# Patient Record
Sex: Male | Born: 1985 | Race: Black or African American | Hispanic: No | Marital: Single | Smoking: Never smoker
Health system: Southern US, Community
[De-identification: ages and names within clinical notes are randomized; demographics above are authoritative.]

## PROBLEM LIST (undated history)

## (undated) DIAGNOSIS — D649 Anemia, unspecified: Secondary | ICD-10-CM

## (undated) HISTORY — PX: WISDOM TOOTH EXTRACTION: SHX21

## (undated) HISTORY — PX: TOENAIL EXCISION: SUR558

## (undated) HISTORY — PX: TESTICLE SURGERY: SHX794

---

## 2020-05-24 ENCOUNTER — Emergency Department (HOSPITAL_COMMUNITY): Payer: Self-pay

## 2020-05-24 ENCOUNTER — Emergency Department (HOSPITAL_COMMUNITY)
Admission: EM | Admit: 2020-05-24 | Discharge: 2020-05-25 | Disposition: A | Discharge status: Home / Self Care | Payer: Self-pay | Attending: Emergency Medicine | Admitting: Emergency Medicine

## 2020-05-24 DIAGNOSIS — Y999 Unspecified external cause status: Secondary | ICD-10-CM | POA: Insufficient documentation

## 2020-05-24 DIAGNOSIS — S42322A Displaced transverse fracture of shaft of humerus, left arm, initial encounter for closed fracture: Secondary | ICD-10-CM | POA: Insufficient documentation

## 2020-05-24 DIAGNOSIS — Y92410 Unspecified street and highway as the place of occurrence of the external cause: Secondary | ICD-10-CM | POA: Insufficient documentation

## 2020-05-24 DIAGNOSIS — Y9389 Activity, other specified: Secondary | ICD-10-CM | POA: Insufficient documentation

## 2020-05-24 DIAGNOSIS — T1490XA Injury, unspecified, initial encounter: Secondary | ICD-10-CM

## 2020-05-24 LAB — COMPREHENSIVE METABOLIC PANEL
ALT: 20 U/L (ref 0–44)
AST: 25 U/L (ref 15–41)
Albumin: 3.8 g/dL (ref 3.5–5.0)
Alkaline Phosphatase: 50 U/L (ref 38–126)
Anion gap: 6 (ref 5–15)
BUN: 16 mg/dL (ref 6–20)
CO2: 24 mmol/L (ref 22–32)
Calcium: 8.4 mg/dL — ABNORMAL LOW (ref 8.9–10.3)
Chloride: 107 mmol/L (ref 98–111)
Creatinine, Ser: 0.95 mg/dL (ref 0.61–1.24)
GFR calc Af Amer: >60 mL/min (ref >60)
GFR calc non Af Amer: >60 mL/min (ref >60)
Glucose, Bld: 130 mg/dL — ABNORMAL HIGH (ref 70–99)
Potassium: 4 mmol/L (ref 3.5–5.1)
Sodium: 137 mmol/L (ref 135–145)
Total Bilirubin: 0.5 mg/dL (ref 0.3–1.2)
Total Protein: 6.5 g/dL (ref 6.5–8.1)

## 2020-05-24 LAB — CBC
HCT: 29.3 % — ABNORMAL LOW (ref 39.0–52.0)
Hemoglobin: 8.6 g/dL — ABNORMAL LOW (ref 13.0–17.0)
MCH: 24.2 pg — ABNORMAL LOW (ref 26.0–34.0)
MCHC: 29.4 g/dL — ABNORMAL LOW (ref 30.0–36.0)
MCV: 82.3 fL (ref 80.0–100.0)
Platelets: 326 10*3/uL (ref 150–400)
RBC: 3.56 MIL/uL — ABNORMAL LOW (ref 4.22–5.81)
RDW: 16 % — ABNORMAL HIGH (ref 11.5–15.5)
WBC: 11 10*3/uL — ABNORMAL HIGH (ref 4.0–10.5)
nRBC: 0 % (ref 0.0–0.2)

## 2020-05-24 LAB — ETHANOL: Alcohol, Ethyl (B): <10 mg/dL (ref <10)

## 2020-05-24 LAB — I-STAT CHEM 8, ED
BUN: 18 mg/dL (ref 6–20)
Calcium, Ion: 1.13 mmol/L — ABNORMAL LOW (ref 1.15–1.40)
Chloride: 105 mmol/L (ref 98–111)
Creatinine, Ser: 0.9 mg/dL (ref 0.61–1.24)
Glucose, Bld: 121 mg/dL — ABNORMAL HIGH (ref 70–99)
HCT: 29 % — ABNORMAL LOW (ref 39.0–52.0)
Hemoglobin: 9.9 g/dL — ABNORMAL LOW (ref 13.0–17.0)
Potassium: 4 mmol/L (ref 3.5–5.1)
Sodium: 139 mmol/L (ref 135–145)
TCO2: 23 mmol/L (ref 22–32)

## 2020-05-24 LAB — LACTIC ACID, PLASMA: Lactic Acid, Venous: 1.5 mmol/L (ref 0.5–1.9)

## 2020-05-24 LAB — PROTIME-INR
INR: 1 (ref 0.8–1.2)
Prothrombin Time: 12.6 seconds (ref 11.4–15.2)

## 2020-05-24 LAB — SAMPLE TO BLOOD BANK

## 2020-05-24 MED ORDER — FENTANYL CITRATE (PF) 100 MCG/2ML IJ SOLN
100.0000 ug | Freq: Once | INTRAMUSCULAR | Status: AC
  Administered 2020-05-25: 100 ug via INTRAVENOUS
  Filled 2020-05-24: qty 2

## 2020-05-24 NOTE — ED Notes (Signed)
Jeremiah Munoz father [phone] would like an update on the pt
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
## 2020-05-25 MED ORDER — OXYCODONE-ACETAMINOPHEN 5-325 MG PO TABS
1.0000 | ORAL_TABLET | ORAL | 0 refills | Status: DC | PRN
Start: 2020-05-25 — End: 2020-05-29

## 2020-05-25 NOTE — Discharge Instructions (Addendum)
A referral has been sent for you to see orthopedics.  Please follow-up with them and the numbers attached if you do not hear from them.  Please also take 1 percocet as needed every 4 hours for pain.

## 2020-05-25 NOTE — ED Notes (Signed)
Splint in place.

## 2020-05-25 NOTE — ED Notes (Signed)
D/C instructions provided to mother/father and patient separately, all verbalized understanding of follow up, meds and wound care. Pt taken via WC to WR to wait for mother

## 2020-05-25 NOTE — Progress Notes (Signed)
Orthopedic Tech Progress Note Patient Details:  Jeremiah Munoz [birth year] [id number]  Ortho Devices Type of Ortho Device: Arm sling, Coapt Ortho Device/Splint Location: lue Ortho Device/Splint Interventions: Ordered, Application, Adjustment   Post Interventions Patient Tolerated: Well Instructions Provided: Care of device, Adjustment of device   Trinna Post 05/25/2020, 12:31 AM

## 2020-05-27 NOTE — ED Provider Notes (Signed)
Reduction of fracture  Date/Time: 05/27/2020 8:51 AM Performed by: Melene Plan, DO Authorized by: Melene Plan, DO  Preparation: Patient was prepped and draped in the usual sterile fashion. Local anesthesia used: no  Anesthesia: Local anesthesia used: no  Sedation: Patient sedated: no  Patient tolerance: patient tolerated the procedure well with no immediate complications Comments: Arm initially held in abnormal lie, the patient's arm was internally rotated and flexed at the elbow with some downward traction of the arm with some improved alignment prior to splinting    SPLINT APPLICATION Date/Time: 8:52 AM Authorized by: Rae Roam Consent: Verbal consent obtained. Risks and benefits: risks, benefits and alternatives were discussed Consent given by: patient Splint applied by: orthopedic technician Location details: L upper arm Splint type: Coaptation splint Supplies used: orthoglass Post-procedure: The splinted body part was neurovascularly unchanged following the procedure. Patient tolerance: Patient tolerated the procedure well with no immediate complications.      Melene Plan, DO 05/27/20 [phone]

## 2020-05-28 ENCOUNTER — Encounter (HOSPITAL_COMMUNITY): Payer: Self-pay | Admitting: *Deleted

## 2020-05-28 ENCOUNTER — Encounter (HOSPITAL_COMMUNITY): Payer: Self-pay | Admitting: Student

## 2020-05-28 ENCOUNTER — Other Ambulatory Visit: Payer: Self-pay

## 2020-05-28 ENCOUNTER — Inpatient Hospital Stay (HOSPITAL_COMMUNITY)
Admission: RE | Admit: 2020-05-28 | Discharge: 2020-05-28 | Disposition: A | Discharge status: Home / Self Care | Payer: Self-pay | Source: Ambulatory Visit

## 2020-05-28 ENCOUNTER — Emergency Department (HOSPITAL_COMMUNITY)
Admission: EM | Admit: 2020-05-28 | Discharge: 2020-05-28 | Disposition: A | Discharge status: Home / Self Care | Payer: Self-pay | Attending: Emergency Medicine | Admitting: Emergency Medicine

## 2020-05-28 ENCOUNTER — Ambulatory Visit: Payer: Self-pay | Admitting: Student

## 2020-05-28 ENCOUNTER — Emergency Department (HOSPITAL_COMMUNITY): Payer: Self-pay

## 2020-05-28 DIAGNOSIS — D649 Anemia, unspecified: Secondary | ICD-10-CM

## 2020-05-28 DIAGNOSIS — M791 Myalgia, unspecified site: Secondary | ICD-10-CM | POA: Insufficient documentation

## 2020-05-28 DIAGNOSIS — Z20822 Contact with and (suspected) exposure to covid-19: Secondary | ICD-10-CM | POA: Insufficient documentation

## 2020-05-28 DIAGNOSIS — M79602 Pain in left arm: Secondary | ICD-10-CM

## 2020-05-28 DIAGNOSIS — S42352A Displaced comminuted fracture of shaft of humerus, left arm, initial encounter for closed fracture: Secondary | ICD-10-CM | POA: Insufficient documentation

## 2020-05-28 DIAGNOSIS — R202 Paresthesia of skin: Secondary | ICD-10-CM | POA: Insufficient documentation

## 2020-05-28 DIAGNOSIS — M26629 Arthralgia of temporomandibular joint, unspecified side: Secondary | ICD-10-CM | POA: Insufficient documentation

## 2020-05-28 DIAGNOSIS — Y939 Activity, unspecified: Secondary | ICD-10-CM | POA: Insufficient documentation

## 2020-05-28 DIAGNOSIS — S42302S Unspecified fracture of shaft of humerus, left arm, sequela: Secondary | ICD-10-CM

## 2020-05-28 DIAGNOSIS — S42302A Unspecified fracture of shaft of humerus, left arm, initial encounter for closed fracture: Secondary | ICD-10-CM | POA: Insufficient documentation

## 2020-05-28 DIAGNOSIS — M542 Cervicalgia: Secondary | ICD-10-CM | POA: Insufficient documentation

## 2020-05-28 DIAGNOSIS — Y929 Unspecified place or not applicable: Secondary | ICD-10-CM | POA: Insufficient documentation

## 2020-05-28 DIAGNOSIS — Y999 Unspecified external cause status: Secondary | ICD-10-CM | POA: Insufficient documentation

## 2020-05-28 LAB — CBC WITH DIFFERENTIAL/PLATELET
Abs Immature Granulocytes: 0.07 10*3/uL (ref 0.00–0.07)
Basophils Absolute: 0 10*3/uL (ref 0.0–0.1)
Basophils Relative: 0 %
Eosinophils Absolute: 0.1 10*3/uL (ref 0.0–0.5)
Eosinophils Relative: 1 %
HCT: 28.4 % — ABNORMAL LOW (ref 39.0–52.0)
Hemoglobin: 8.8 g/dL — ABNORMAL LOW (ref 13.0–17.0)
Immature Granulocytes: 1 %
Lymphocytes Relative: 12 %
Lymphs Abs: 1.4 10*3/uL (ref 0.7–4.0)
MCH: 24.9 pg — ABNORMAL LOW (ref 26.0–34.0)
MCHC: 31 g/dL (ref 30.0–36.0)
MCV: 80.5 fL (ref 80.0–100.0)
Monocytes Absolute: 1.4 10*3/uL — ABNORMAL HIGH (ref 0.1–1.0)
Monocytes Relative: 12 %
Neutro Abs: 8.5 10*3/uL — ABNORMAL HIGH (ref 1.7–7.7)
Neutrophils Relative %: 74 %
Platelets: 320 10*3/uL (ref 150–400)
RBC: 3.53 MIL/uL — ABNORMAL LOW (ref 4.22–5.81)
RDW: 15.2 % (ref 11.5–15.5)
WBC: 11.5 10*3/uL — ABNORMAL HIGH (ref 4.0–10.5)
nRBC: 0 % (ref 0.0–0.2)

## 2020-05-28 LAB — BASIC METABOLIC PANEL
Anion gap: 11 (ref 5–15)
BUN: 16 mg/dL (ref 6–20)
CO2: 22 mmol/L (ref 22–32)
Calcium: 8.6 mg/dL — ABNORMAL LOW (ref 8.9–10.3)
Chloride: 103 mmol/L (ref 98–111)
Creatinine, Ser: 0.86 mg/dL (ref 0.61–1.24)
GFR calc Af Amer: >60 mL/min (ref >60)
GFR calc non Af Amer: >60 mL/min (ref >60)
Glucose, Bld: 107 mg/dL — ABNORMAL HIGH (ref 70–99)
Potassium: 3.9 mmol/L (ref 3.5–5.1)
Sodium: 136 mmol/L (ref 135–145)

## 2020-05-28 LAB — SARS CORONAVIRUS 2 BY RT PCR (HOSPITAL ORDER, PERFORMED IN ~~LOC~~ HOSPITAL LAB): SARS Coronavirus 2: NEGATIVE

## 2020-05-28 LAB — CK: Total CK: 2251 U/L — ABNORMAL HIGH (ref 49–397)

## 2020-05-28 MED ORDER — SODIUM CHLORIDE 0.9 % IV BOLUS
1000.0000 mL | Freq: Once | INTRAVENOUS | Status: AC
  Administered 2020-05-28: 1000 mL via INTRAVENOUS

## 2020-05-28 MED ORDER — FENTANYL CITRATE (PF) 100 MCG/2ML IJ SOLN
50.0000 ug | Freq: Once | INTRAMUSCULAR | Status: AC
  Administered 2020-05-28: 50 ug via INTRAVENOUS
  Filled 2020-05-28: qty 2
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
## 2020-05-28 NOTE — ED Notes (Signed)
Patient demanding pain medication. ED providers aware.

## 2020-05-28 NOTE — Patient Instructions (Signed)
DUE TO COVID-19 ONLY ONE VISITOR IS ALLOWED TO COME WITH YOU AND STAY IN THE WAITING ROOM ONLY DURING PRE OP AND PROCEDURE.   IF YOU WILL BE ADMITTED INTO THE HOSPITAL YOU ARE ALLOWED ONE SUPPORT PERSON DURING VISITATION HOURS ONLY (10AM -8PM)   . The support person may change daily. . The support person must pass our screening, gel in and out, and wear a mask at all times, including in the patient's room. . Patients must also wear a mask when staff or their support person are in the room.   COVID SWAB TESTING MUST BE COMPLETED ON:     [address] [place], [place] [address]  (Must self quarantine after testing. Follow instructions on handout.)  [facility] Entrance [address] (Must self quarantine after testing. Follow instructions on handout.)       Your procedure is scheduled on:    Report to [facility] Main  Entrance   Report to Short Stay at 5:30 AM   [facility]    Report to admitting at AM   Call this number if you have problems the morning of surgery [phone]   Do not eat food :After Midnight.   May have liquids until    day of surgery  CLEAR LIQUID DIET  Foods Allowed                                                                     Foods Excluded  Water, Black Coffee and tea, regular and decaf                             liquids that you cannot  Plain Jell-O in any flavor  (No red)                                           see through such as: Fruit ices (not with fruit pulp)                                     milk, soups, orange juice              Iced Popsicles (No red)                                    All solid food                                   Apple juices Sports drinks like Gatorade (No red) Lightly seasoned clear broth or consume(fat free) Sugar, honey syrup  Sample Menu Breakfast                                Lunch  Supper Cranberry juice                     Beef broth                            Chicken broth Jell-O                                     Grape juice                           Apple juice Coffee or tea                        Jell-O                                      Popsicle                                                Coffee or tea                        Coffee or tea      Complete one Ensure drink the morning of surgery at       the day of surgery.   Drink 2 Ensure drinks the night before surgery.  Complete one Ensure drink the morning of surgery 3 hours prior to scheduled surgery.   Oral Hygiene is also important to reduce your risk of infection.                                    Remember - BRUSH YOUR TEETH THE MORNING OF SURGERY WITH YOUR REGULAR TOOTHPASTE   Do NOT smoke after Midnight   Take these medicines the morning of surgery with A SIP OF WATER:   DO NOT TAKE ANY ORAL DIABETIC MEDICATIONS DAY OF YOUR SURGERY                               You may not have any metal on your body including hair pins, jewelry, and body piercings             Do not wear make-up, lotions, powders, perfumes/cologne, or deodorant             Do not wear nail polish.  Do not shave  48 hours prior to surgery.              Men may shave face and neck.   Do not bring valuables to the hospital. [facility] IS NOT             RESPONSIBLE   FOR VALUABLES.   Contacts, dentures or bridgework may not be worn into surgery.   Bring small overnight bag day of surgery.    Patients discharged the day of surgery will not be allowed to drive home.   Special Instructions: Bring a copy of your healthcare power of attorney and  living will documents         the day of surgery if you haven't scanned them in before.              Please read over the following fact sheets you were given: IF YOU HAVE QUESTIONS ABOUT YOUR PRE OP INSTRUCTIONS PLEASE CALL [phone]   [facility] - Preparing for Surgery Before surgery, you can play an important  role.  Because skin is not sterile, your skin needs to be as free of germs as possible.  You can reduce the number of germs on your skin by washing with CHG (chlorahexidine gluconate) soap before surgery.  CHG is an antiseptic cleaner which kills germs and bonds with the skin to continue killing germs even after washing. Please DO NOT use if you have an allergy to CHG or antibacterial soaps.  If your skin becomes reddened/irritated stop using the CHG and inform your nurse when you arrive at Short Stay. Do not shave (including legs and underarms) for at least 48 hours prior to the first CHG shower.  You may shave your face/neck.  Please follow these instructions carefully:  1.  Shower with CHG Soap the night before surgery and the  morning of surgery.  2.  If you choose to wash your hair, wash your hair first as usual with your normal  shampoo.  3.  After you shampoo, rinse your hair and body thoroughly to remove the shampoo.                             4.  Use CHG as you would any other liquid soap.  You can apply chg directly to the skin and wash.  Gently with a scrungie or clean washcloth.  5.  Apply the CHG Soap to your body ONLY FROM THE NECK DOWN.   Do   not use on face/ open                           Wound or open sores. Avoid contact with eyes, ears mouth and   genitals (private parts).                       Wash face,  Genitals (private parts) with your normal soap.             6.  Wash thoroughly, paying special attention to the area where your    surgery  will be performed.  7.  Thoroughly rinse your body with warm water from the neck down.  8.  DO NOT shower/wash with your normal soap after using and rinsing off the CHG Soap.                9.  Pat yourself dry with a clean towel.            10.  Wear clean pajamas.            11.  Place clean sheets on your bed the night of your first shower and do not  sleep with pets. Day of Surgery : Do not apply any lotions/deodorants the morning  of surgery.  Please wear clean clothes to the hospital/surgery center.  FAILURE TO FOLLOW THESE INSTRUCTIONS MAY RESULT IN THE CANCELLATION OF YOUR SURGERY  PATIENT SIGNATURE_________________________________  NURSE SIGNATURE__________________________________  ________________________________________________________________________

## 2020-05-28 NOTE — ED Provider Notes (Signed)
  Physical Exam  BP 135/87   Pulse 96   Temp 98.1 F (36.7 C) (Oral)   Resp 14   Ht 5\' 8"  (1.727 m)   Wt 56.7 kg   SpO2 100%   BMI 19.01 kg/m   Physical Exam  ED Course/Procedures   Clinical Course as of May 28 728  Thu May 28, 2020  May 30, 2020 Spoke with Dr. [id number] regarding repeat x-rays of the left humerus.  He reports significant improvement in angulation from previous imaging.  We discussed decreased sensation to the left chest wall.  This could be secondary to coaptation splint pressing on the brachial plexus, but could be related to the patient's neck.  No further recommendations from orthopedics at this time.   [MM]    Clinical Course User Index [MM] McDonald, Mia A, PA-C    Procedures  MDM  Pt signed out to me by Ave Filter, PA-C.  Please see previous note for further history.  In brief, patient resenting for evaluation of worsening left arm pain and numbness.  On exam, patient with numbness of the left upper extremity and left anterior chest on the clavicle to just below the nipple.  Patient recently had injury and was seen at [facility], ED on 8/23, found to have a midshaft humerus fracture.  Repeat x-ray shows humerus healing as expected.  Previous provider discussed with Dr. 9/23 who is on-call for [facility], who felt the bone was healing well, but suggested maybe the coaptiation splint was too tight and placing pressure on the brachial plexus. CT's pending of head and neck to ensure no fx or bleed or other cause for numbness. If normal, plan for d/c.   CT head and neck negative for acute findings.  Discussed with patient and mom.  Discussed follow-up with PCP for further evaluation of anemia.  Encourage follow-up with orthopedics for further evaluation and management of his humerus fracture.  On reassessment, patient with good color and warmth of the L hand. Good radial pulse. Pt with continued numbness of the arm. Compartments are soft, exam not c/w compartment  syndrome. At this time, pt appears safe for d/c. Return precautions given. Pt states he understands and agrees to plan.      Ave Filter, PA-C 05/28/20 05/30/20    3500, MD 05/28/20 2201

## 2020-05-28 NOTE — ED Notes (Signed)
Ortho at bedside splinting left arm.

## 2020-05-28 NOTE — Discharge Instructions (Signed)
Follow-up with Dr. Luiz Blare regarding further management of your arm. Follow-up with your primary care doctor for monitoring and continued management of your lower blood counts.  You may follow-up with the [facility] wellness clinic listed below, or you may call the 800-number in the paperwork to help you set up primary care. Take ibuprofen 3 times a day with meals.  Take 600 mg (3 tablets) at a time. Do not take other anti-inflammatories at the same time (Advil, Motrin, naproxen, Aleve).  Take 650 mg of tylenol (2 tablets) 3 times a day for pain.  Use the narcotic pain medicine as needed for severe or breakthrough pain.  Return to the ER if you develop new or worsening numbness, color change of your fingers, or any new, worsening, or concerning symptoms.

## 2020-05-28 NOTE — Progress Notes (Signed)
COVID Vaccine Completed: Date COVID Vaccine completed: COVID vaccine manufacturer: Pfizer    Moderna   [employer]   PCP -  Cardiologist -   Chest x-ray - 05-24-20 EKG -  Stress Test -  ECHO -  Cardiac Cath -   Sleep Study -  CPAP -   Fasting Blood Sugar -  Checks Blood Sugar _____ times a day  Blood Thinner Instructions: Aspirin Instructions: Last Dose:  Anesthesia review:   Patient denies shortness of breath, fever, cough and chest pain at PAT appointment   Patient verbalized understanding of instructions that were given to them at the PAT appointment. Patient was also instructed that they will need to review over the PAT instructions again at home before surgery.

## 2020-05-28 NOTE — ED Notes (Signed)
Patients wounds on arms redressed.
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
## 2020-05-28 NOTE — Progress Notes (Signed)
Spoke with pt and his mother, Jola Babinski for pre-op call. Pt denies cardiac history, Diabetes or HTN. Pt's mother expressed concern because she was told pt's hemoglobin was "extremely low" today by a nurse at [employer]. She is very concerned that pt's surgery will be cancelled. I spoke with Dr. Mal Amabile, Anesthesiologist and showed him the Hgb of 8.8 that was done in the ED early this AM. Dr. Mal Amabile stated that Hgb would not cancel pt's surgery. I called Mrs. Villada back and let her know. She was very grateful to hear this.  Pt will need a Covid test on arrival. Pt had one early this AM, but he has not quarantined today.

## 2020-05-28 NOTE — ED Triage Notes (Signed)
Pt arrives ambulatory to triage with c/o left arm pain. Pt recently seen for left humerus fracture. Says tonight, he feels increased pain and numbness, that started in his hand and moved up through his arm. Palpable radial pulse.

## 2020-05-29 ENCOUNTER — Ambulatory Visit (HOSPITAL_COMMUNITY): Payer: Self-pay

## 2020-05-29 ENCOUNTER — Encounter (HOSPITAL_COMMUNITY)
Admission: RE | Disposition: A | Discharge status: Home / Self Care | Payer: Self-pay | Source: Home / Self Care | Attending: Student

## 2020-05-29 ENCOUNTER — Ambulatory Visit (HOSPITAL_COMMUNITY): Payer: Self-pay | Admitting: Anesthesiology

## 2020-05-29 ENCOUNTER — Encounter (HOSPITAL_COMMUNITY): Payer: Self-pay | Admitting: Student

## 2020-05-29 ENCOUNTER — Other Ambulatory Visit: Payer: Self-pay

## 2020-05-29 ENCOUNTER — Ambulatory Visit (HOSPITAL_COMMUNITY)
Admission: RE | Admit: 2020-05-29 | Discharge: 2020-05-29 | Disposition: A | Discharge status: Home / Self Care | Payer: Self-pay | Attending: Student | Admitting: Student

## 2020-05-29 DIAGNOSIS — Z419 Encounter for procedure for purposes other than remedying health state, unspecified: Secondary | ICD-10-CM

## 2020-05-29 DIAGNOSIS — T148XXA Other injury of unspecified body region, initial encounter: Secondary | ICD-10-CM

## 2020-05-29 DIAGNOSIS — X58XXXA Exposure to other specified factors, initial encounter: Secondary | ICD-10-CM | POA: Insufficient documentation

## 2020-05-29 DIAGNOSIS — S42352A Displaced comminuted fracture of shaft of humerus, left arm, initial encounter for closed fracture: Secondary | ICD-10-CM

## 2020-05-29 DIAGNOSIS — S42302A Unspecified fracture of shaft of humerus, left arm, initial encounter for closed fracture: Secondary | ICD-10-CM | POA: Insufficient documentation

## 2020-05-29 DIAGNOSIS — G5632 Lesion of radial nerve, left upper limb: Secondary | ICD-10-CM | POA: Insufficient documentation

## 2020-05-29 HISTORY — PX: ORIF HUMERUS FRACTURE: SHX2126

## 2020-05-29 HISTORY — DX: Anemia, unspecified: D64.9

## 2020-05-29 SURGERY — OPEN REDUCTION INTERNAL FIXATION (ORIF) HUMERAL SHAFT FRACTURE
Anesthesia: General | Site: Arm Upper | Laterality: Left

## 2020-05-29 MED ORDER — VANCOMYCIN HCL 1000 MG IV SOLR
INTRAVENOUS | Status: AC
  Filled 2020-05-29: qty 1000

## 2020-05-29 MED ORDER — POVIDONE-IODINE 10 % EX SWAB: 2.0000 "application " | Freq: Once | CUTANEOUS | Status: DC

## 2020-05-29 MED ORDER — ACETAMINOPHEN 500 MG PO TABS
1000.0000 mg | ORAL_TABLET | Freq: Once | ORAL | Status: AC
  Administered 2020-05-29: 1000 mg via ORAL
  Filled 2020-05-29: qty 2

## 2020-05-29 MED ORDER — METHOCARBAMOL 500 MG PO TABS
500.0000 mg | ORAL_TABLET | Freq: Once | ORAL | Status: AC
  Administered 2020-05-29: 500 mg via ORAL

## 2020-05-29 MED ORDER — OXYCODONE-ACETAMINOPHEN 5-325 MG PO TABS
ORAL_TABLET | ORAL | Status: AC
  Administered 2020-05-29: 1
  Filled 2020-05-29: qty 1

## 2020-05-29 MED ORDER — SUGAMMADEX SODIUM 200 MG/2ML IV SOLN
INTRAVENOUS | Status: DC | PRN
  Administered 2020-05-29: 220 mg via INTRAVENOUS

## 2020-05-29 MED ORDER — FENTANYL CITRATE (PF) 250 MCG/5ML IJ SOLN
INTRAMUSCULAR | Status: AC
Start: 2020-05-29 — End: ?
  Filled 2020-05-29: qty 5

## 2020-05-29 MED ORDER — LIDOCAINE 2% (20 MG/ML) 5 ML SYRINGE
INTRAMUSCULAR | Status: AC
  Filled 2020-05-29: qty 5

## 2020-05-29 MED ORDER — DEXMEDETOMIDINE (PRECEDEX) IN NS 20 MCG/5ML (4 MCG/ML) IV SYRINGE
PREFILLED_SYRINGE | INTRAVENOUS | Status: AC
  Filled 2020-05-29: qty 5

## 2020-05-29 MED ORDER — FENTANYL CITRATE (PF) 250 MCG/5ML IJ SOLN
INTRAMUSCULAR | Status: DC | PRN
Start: 2020-05-29 — End: 2020-05-29
  Administered 2020-05-29 (×2): 100 ug via INTRAVENOUS
  Administered 2020-05-29 (×2): 50 ug via INTRAVENOUS
  Administered 2020-05-29: 100 ug via INTRAVENOUS

## 2020-05-29 MED ORDER — LIDOCAINE HCL (PF) 2 % IJ SOLN
INTRAMUSCULAR | Status: AC
  Filled 2020-05-29: qty 10

## 2020-05-29 MED ORDER — CHLORHEXIDINE GLUCONATE 4 % EX LIQD: 60.0000 mL | Freq: Once | CUTANEOUS | Status: DC

## 2020-05-29 MED ORDER — MIDAZOLAM HCL 2 MG/2ML IJ SOLN
INTRAMUSCULAR | Status: DC | PRN
  Administered 2020-05-29: 2 mg via INTRAVENOUS

## 2020-05-29 MED ORDER — BUPIVACAINE HCL (PF) 0.25 % IJ SOLN
INTRAMUSCULAR | Status: AC
  Filled 2020-05-29: qty 30

## 2020-05-29 MED ORDER — DEXAMETHASONE SODIUM PHOSPHATE 10 MG/ML IJ SOLN
INTRAMUSCULAR | Status: AC
  Filled 2020-05-29: qty 1

## 2020-05-29 MED ORDER — CEFAZOLIN SODIUM 1 G IJ SOLR
INTRAMUSCULAR | Status: AC
  Filled 2020-05-29: qty 20

## 2020-05-29 MED ORDER — FENTANYL CITRATE (PF) 250 MCG/5ML IJ SOLN
INTRAMUSCULAR | Status: AC
  Filled 2020-05-29: qty 5

## 2020-05-29 MED ORDER — ONDANSETRON HCL 4 MG/2ML IJ SOLN
INTRAMUSCULAR | Status: DC | PRN
  Administered 2020-05-29: 4 mg via INTRAVENOUS

## 2020-05-29 MED ORDER — ONDANSETRON HCL 4 MG/2ML IJ SOLN
INTRAMUSCULAR | Status: AC
  Filled 2020-05-29: qty 2

## 2020-05-29 MED ORDER — VANCOMYCIN HCL 1000 MG IV SOLR
INTRAVENOUS | Status: DC | PRN
  Administered 2020-05-29: 1000 mg via TOPICAL

## 2020-05-29 MED ORDER — ROCURONIUM BROMIDE 10 MG/ML (PF) SYRINGE
PREFILLED_SYRINGE | INTRAVENOUS | Status: AC
  Filled 2020-05-29: qty 10

## 2020-05-29 MED ORDER — LACTATED RINGERS IV SOLN: INTRAVENOUS | Status: DC

## 2020-05-29 MED ORDER — BUPIVACAINE HCL (PF) 0.25 % IJ SOLN
INTRAMUSCULAR | Status: DC | PRN
  Administered 2020-05-29: 20 mL

## 2020-05-29 MED ORDER — 0.9 % SODIUM CHLORIDE (POUR BTL) OPTIME
TOPICAL | Status: DC | PRN
  Administered 2020-05-29: 1000 mL

## 2020-05-29 MED ORDER — ROCURONIUM BROMIDE 10 MG/ML (PF) SYRINGE
PREFILLED_SYRINGE | INTRAVENOUS | Status: DC | PRN
  Administered 2020-05-29 (×2): 50 mg via INTRAVENOUS

## 2020-05-29 MED ORDER — METHOCARBAMOL 500 MG PO TABS
ORAL_TABLET | ORAL | Status: AC
  Filled 2020-05-29: qty 1

## 2020-05-29 MED ORDER — PROPOFOL 10 MG/ML IV BOLUS
INTRAVENOUS | Status: AC
  Filled 2020-05-29: qty 20

## 2020-05-29 MED ORDER — LIDOCAINE 2% (20 MG/ML) 5 ML SYRINGE
INTRAMUSCULAR | Status: DC | PRN
  Administered 2020-05-29: 60 mg via INTRAVENOUS

## 2020-05-29 MED ORDER — HYDROMORPHONE HCL 1 MG/ML IJ SOLN
0.2500 mg | INTRAMUSCULAR | Status: DC | PRN
  Administered 2020-05-29: 1 mg via INTRAVENOUS

## 2020-05-29 MED ORDER — MIDAZOLAM HCL 2 MG/2ML IJ SOLN
INTRAMUSCULAR | Status: AC
  Filled 2020-05-29: qty 2

## 2020-05-29 MED ORDER — PROPOFOL 10 MG/ML IV BOLUS
INTRAVENOUS | Status: DC | PRN
  Administered 2020-05-29: 150 mg via INTRAVENOUS

## 2020-05-29 MED ORDER — CHLORHEXIDINE GLUCONATE 0.12 % MT SOLN
OROMUCOSAL | Status: AC
  Administered 2020-05-29: 15 mL
  Filled 2020-05-29: qty 15

## 2020-05-29 MED ORDER — OXYCODONE-ACETAMINOPHEN 5-325 MG PO TABS: 1.0000 | ORAL_TABLET | ORAL | 0 refills | Status: DC | PRN

## 2020-05-29 MED ORDER — CEFAZOLIN SODIUM-DEXTROSE 2-4 GM/100ML-% IV SOLN
2.0000 g | INTRAVENOUS | Status: AC
  Administered 2020-05-29: 2 g via INTRAVENOUS
  Filled 2020-05-29: qty 100

## 2020-05-29 MED ORDER — DEXAMETHASONE SODIUM PHOSPHATE 10 MG/ML IJ SOLN
INTRAMUSCULAR | Status: DC | PRN
  Administered 2020-05-29: 10 mg via INTRAVENOUS

## 2020-05-29 MED ORDER — METHOCARBAMOL 500 MG PO TABS: 500.0000 mg | ORAL_TABLET | Freq: Four times a day (QID) | ORAL | 0 refills | Status: DC | PRN

## 2020-05-29 MED ORDER — OXYCODONE-ACETAMINOPHEN 5-325 MG PO TABS
1.0000 | ORAL_TABLET | Freq: Once | ORAL | Status: AC
  Administered 2020-05-29: 1 via ORAL

## 2020-05-29 MED ORDER — HYDROMORPHONE HCL 1 MG/ML IJ SOLN
INTRAMUSCULAR | Status: AC
  Filled 2020-05-29: qty 1

## 2020-05-29 SURGICAL SUPPLY — 66 items
BIT DRILL 2.5X110 QC LCP DISP (BIT) ×3 IMPLANT
BNDG COHESIVE 4X5 TAN STRL (GAUZE/BANDAGES/DRESSINGS) IMPLANT
BNDG ELASTIC 4X5.8 VLCR STR LF (GAUZE/BANDAGES/DRESSINGS) IMPLANT
BNDG ELASTIC 6X5.8 VLCR STR LF (GAUZE/BANDAGES/DRESSINGS) IMPLANT
BRUSH SCRUB EZ PLAIN DRY (MISCELLANEOUS) ×3 IMPLANT
CHLORAPREP W/TINT 26 (MISCELLANEOUS) ×3 IMPLANT
COVER SURGICAL LIGHT HANDLE (MISCELLANEOUS) ×6 IMPLANT
COVER WAND RF STERILE (DRAPES) ×3 IMPLANT
DERMABOND ADVANCED (GAUZE/BANDAGES/DRESSINGS) ×2
DERMABOND ADVANCED .7 DNX12 (GAUZE/BANDAGES/DRESSINGS) ×1 IMPLANT
DRAPE C-ARM 42X72 X-RAY (DRAPES) ×3 IMPLANT
DRAPE INCISE IOBAN 66X45 STRL (DRAPES) ×3 IMPLANT
DRAPE ORTHO SPLIT 77X108 STRL (DRAPES) ×4
DRAPE SURG 17X23 STRL (DRAPES) ×3 IMPLANT
DRAPE SURG ORHT 6 SPLT 77X108 (DRAPES) ×2 IMPLANT
DRAPE U-SHAPE 47X51 STRL (DRAPES) ×6 IMPLANT
DRSG MEPILEX BORDER 4X8 (GAUZE/BANDAGES/DRESSINGS) ×3 IMPLANT
DRSG PAD ABDOMINAL 8X10 ST (GAUZE/BANDAGES/DRESSINGS) IMPLANT
ELECT REM PT RETURN 9FT ADLT (ELECTROSURGICAL) ×3
ELECTRODE REM PT RTRN 9FT ADLT (ELECTROSURGICAL) ×1 IMPLANT
EVACUATOR 1/8 PVC DRAIN (DRAIN) IMPLANT
GLOVE BIO SURGEON STRL SZ 6.5 (GLOVE) ×6 IMPLANT
GLOVE BIO SURGEON STRL SZ7.5 (GLOVE) ×12 IMPLANT
GLOVE BIO SURGEONS STRL SZ 6.5 (GLOVE) ×3
GLOVE BIOGEL PI IND STRL 6.5 (GLOVE) ×1 IMPLANT
GLOVE BIOGEL PI IND STRL 7.5 (GLOVE) ×1 IMPLANT
GLOVE BIOGEL PI INDICATOR 6.5 (GLOVE) ×2
GLOVE BIOGEL PI INDICATOR 7.5 (GLOVE) ×2
GOWN STRL REUS W/ TWL LRG LVL3 (GOWN DISPOSABLE) ×2 IMPLANT
GOWN STRL REUS W/TWL LRG LVL3 (GOWN DISPOSABLE) ×4
KIT BASIN OR (CUSTOM PROCEDURE TRAY) ×3 IMPLANT
KIT TURNOVER KIT B (KITS) ×3 IMPLANT
MANIFOLD NEPTUNE II (INSTRUMENTS) IMPLANT
NEEDLE 22X1 1/2 (OR ONLY) (NEEDLE) ×3 IMPLANT
NEEDLE HYPO 25X1 1.5 SAFETY (NEEDLE) IMPLANT
NS IRRIG 1000ML POUR BTL (IV SOLUTION) ×3 IMPLANT
PACK ORTHO EXTREMITY (CUSTOM PROCEDURE TRAY) ×3 IMPLANT
PAD ARMBOARD 7.5X6 YLW CONV (MISCELLANEOUS) ×6 IMPLANT
PROS LCP PLATE 10 137M (Plate) ×3 IMPLANT
PROSTHESIS LCP PLATE 10 137M (Plate) ×1 IMPLANT
SCREW CORTEX 3.5 24MM (Screw) ×10 IMPLANT
SCREW CORTEX 3.5 26MM (Screw) ×6 IMPLANT
SCREW LOCK CORT ST 3.5X24 (Screw) ×5 IMPLANT
SCREW LOCK CORT ST 3.5X26 (Screw) ×3 IMPLANT
SLING ARM IMMOBILIZER LRG (SOFTGOODS) ×3 IMPLANT
SLING ARM IMMOBILIZER MED (SOFTGOODS) IMPLANT
SPONGE LAP 18X18 RF (DISPOSABLE) ×3 IMPLANT
STAPLER VISISTAT 35W (STAPLE) IMPLANT
SUCTION FRAZIER HANDLE 10FR (MISCELLANEOUS) ×2
SUCTION TUBE FRAZIER 10FR DISP (MISCELLANEOUS) ×1 IMPLANT
SUT ETHILON 3 0 PS 1 (SUTURE) IMPLANT
SUT MNCRL AB 3-0 PS2 18 (SUTURE) ×3 IMPLANT
SUT PROLENE 0 CT (SUTURE) IMPLANT
SUT VIC AB 0 CT1 27 (SUTURE) ×2
SUT VIC AB 0 CT1 27XBRD ANBCTR (SUTURE) ×1 IMPLANT
SUT VIC AB 2-0 CT1 27 (SUTURE) ×2
SUT VIC AB 2-0 CT1 TAPERPNT 27 (SUTURE) ×1 IMPLANT
SYR 10ML LL (SYRINGE) ×3 IMPLANT
SYR CONTROL 10ML LL (SYRINGE) IMPLANT
TOWEL GREEN STERILE (TOWEL DISPOSABLE) ×9 IMPLANT
TOWEL GREEN STERILE FF (TOWEL DISPOSABLE) ×3 IMPLANT
TRAY FOLEY MTR SLVR 16FR STAT (SET/KITS/TRAYS/PACK) IMPLANT
TUBE CONNECTING 12'X1/4 (SUCTIONS) ×1
TUBE CONNECTING 12X1/4 (SUCTIONS) ×2 IMPLANT
WATER STERILE IRR 1000ML POUR (IV SOLUTION) IMPLANT
YANKAUER SUCT BULB TIP NO VENT (SUCTIONS) ×3 IMPLANT

## 2020-05-29 NOTE — Anesthesia Postprocedure Evaluation (Signed)
Anesthesia Post Note  Patient: ELEFTHERIOS DUDENHOEFFER IV  Procedure(s) Performed: OPEN REDUCTION INTERNAL FIXATION (ORIF) HUMERAL SHAFT FRACTURE (Left Arm Upper)     Patient location during evaluation: PACU Anesthesia Type: General Level of consciousness: awake and alert Pain management: pain level controlled Vital Signs Assessment: post-procedure vital signs reviewed and stable Respiratory status: spontaneous breathing, nonlabored ventilation, respiratory function stable and patient connected to nasal cannula oxygen Cardiovascular status: blood pressure returned to baseline and stable Postop Assessment: no apparent nausea or vomiting Anesthetic complications: no   No complications documented.  Last Vitals:  Vitals:   05/29/20 1120 05/29/20 1145  BP: (!) 144/95 (!) 145/94  Pulse: 95 89  Resp: 19 18  Temp:  36.4 C  SpO2: (!) 89% 99%    Last Pain:  Vitals:   05/29/20 1100  TempSrc:   PainSc: Asleep                 Saleen Peden L Carliss Porcaro

## 2020-05-29 NOTE — Progress Notes (Signed)
Pt stable and in phase ii, awaiting ride.

## 2020-05-29 NOTE — Transfer of Care (Signed)
Immediate Anesthesia Transfer of Care Note  Patient: Jeremiah Munoz  Procedure(s) Performed: OPEN REDUCTION INTERNAL FIXATION (ORIF) HUMERAL SHAFT FRACTURE (Left Arm Upper)  Patient Location: PACU  Anesthesia Type:General  Level of Consciousness: drowsy and patient cooperative  Airway & Oxygen Therapy: Patient Spontanous Breathing  Post-op Assessment: Report given to RN and Post -op Vital signs reviewed and stable  Post vital signs: Reviewed and stable  Last Vitals:  Vitals Value Taken Time  BP 125/81 05/29/20 1003  Temp    Pulse    Resp 27 05/29/20 1004  SpO2    Vitals shown include unvalidated device data.  Last Pain:  Vitals:   05/29/20 0650  TempSrc:   PainSc: 8       Patients Stated Pain Goal: 2 (05/29/20 0650)  Complications: No complications documented.
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
## 2020-05-29 NOTE — Discharge Instructions (Addendum)
[facility] Discharge Instructions   General Discharge Instructions  WEIGHT BEARING STATUS: Non-weightbearing left upper extremity  RANGE OF MOTION/ACTIVITY: Wear sling for comfort. May have unrestricted shoulder and elbow motion as tolerated  Wound Care: You may remove your surgical dressing on post op day #2 (Sunday 05/31/20). Incisions can be left open to air if there is no drainage. If incision continues to have drainage, follow wound care instructions below. Okay to shower if no drainage from incisions.  DVT/PE prophylaxis: None  Diet: as you were eating previously.  Can use over the counter stool softeners and bowel preparations, such as Miralax, to help with bowel movements.  Narcotics can be constipating.  Be sure to drink plenty of fluids  PAIN MEDICATION USE AND EXPECTATIONS  You have likely been given narcotic medications to help control your pain.  After a traumatic event that results in an fracture (broken bone) with or without surgery, it is ok to use narcotic pain medications to help control one's pain.  We understand that everyone responds to pain differently and each individual patient will be evaluated on a regular basis for the continued need for narcotic medications. Ideally, narcotic medication use should last no more than 6-8 weeks (coinciding with fracture healing).   As a patient it is your responsibility as well to monitor narcotic medication use and report the amount and frequency you use these medications when you come to your office visit.   We would also advise that if you are using narcotic medications, you should take a dose prior to therapy to maximize you participation.  IF YOU ARE ON NARCOTIC MEDICATIONS IT IS NOT PERMISSIBLE TO OPERATE A MOTOR VEHICLE (MOTORCYCLE/CAR/TRUCK/MOPED) OR HEAVY MACHINERY DO NOT MIX NARCOTICS WITH OTHER CNS (CENTRAL NERVOUS SYSTEM) DEPRESSANTS SUCH AS ALCOHOL   STOP SMOKING OR USING NICOTINE PRODUCTS!!!!  As  discussed nicotine severely impairs your body's ability to heal surgical and traumatic wounds but also impairs bone healing.  Wounds and bone heal by forming microscopic blood vessels (angiogenesis) and nicotine is a vasoconstrictor (essentially, shrinks blood vessels).  Therefore, if vasoconstriction occurs to these microscopic blood vessels they essentially disappear and are unable to deliver necessary nutrients to the healing tissue.  This is one modifiable factor that you can do to dramatically increase your chances of healing your injury.    (This means no smoking, no nicotine gum, patches, etc)  DO NOT USE NONSTEROIDAL ANTI-INFLAMMATORY DRUGS (NSAID'S)  Using products such as Advil (ibuprofen), Aleve (naproxen), Motrin (ibuprofen) for additional pain control during fracture healing can delay and/or prevent the healing response.  If you would like to take over the counter (OTC) medication, Tylenol (acetaminophen) is ok.  However, some narcotic medications that are given for pain control contain acetaminophen as well. Therefore, you should not exceed more than 4000 mg of tylenol in a day if you do not have liver disease.  Also note that there are may OTC medicines, such as cold medicines and allergy medicines that my contain tylenol as well.  If you have any questions about medications and/or interactions please ask your doctor/PA or your pharmacist.      ICE AND ELEVATE INJURED/OPERATIVE EXTREMITY  Using ice and elevating the injured extremity above your heart can help with swelling and pain control.  Icing in a pulsatile fashion, such as 20 minutes on and 20 minutes off, can be followed.    Do not place ice directly on skin. Make sure there is a barrier between to  skin and the ice pack.    Using frozen items such as frozen peas works well as the conform nicely to the are that needs to be iced.  USE AN ACE WRAP OR TED HOSE FOR SWELLING CONTROL  In addition to icing and elevation, Ace wraps or TED  hose are used to help limit and resolve swelling.  It is recommended to use Ace wraps or TED hose until you are informed to stop.    When using Ace Wraps start the wrapping distally (farthest away from the body) and wrap proximally (closer to the body)   Example: If you had surgery on your leg or thing and you do not have a splint on, start the ace wrap at the toes and work your way up to the thigh        If you had surgery on your upper extremity and do not have a splint on, start the ace wrap at your fingers and work your way up to the upper arm   CALL THE OFFICE WITH ANY QUESTIONS OR CONCERNS: [phone]   VISIT OUR WEBSITE FOR ADDITIONAL INFORMATION: [email or web address]     Discharge Wound Care Instructions  Do NOT apply any ointments, solutions or lotions to pin sites or surgical wounds.  These prevent needed drainage and even though solutions like hydrogen peroxide kill bacteria, they also damage cells lining the pin sites that help fight infection.  Applying lotions or ointments can keep the wounds moist and can cause them to breakdown and open up as well. This can increase the risk for infection. When in doubt call the office.  Surgical incisions should be dressed daily.  If any drainage is noted, use one layer of adaptic, then gauze, Kerlix, and an ace wrap.  Once the incision is completely dry and without drainage, it may be left open to air out.  Showering may begin 36-48 hours later.  Cleaning gently with soap and water.  Traumatic wounds should be dressed daily as well.    One layer of adaptic, gauze, Kerlix, then ace wrap.  The adaptic can be discontinued once the draining has ceased    If you have a wet to dry dressing: wet the gauze with saline the squeeze as much saline out so the gauze is moist (not soaking wet), place moistened gauze over wound, then place a dry gauze over the moist one, followed by Kerlix wrap, then ace wrap.

## 2020-05-29 NOTE — Anesthesia Procedure Notes (Signed)
Procedure Name: Intubation Date/Time: 05/29/2020 8:23 AM Performed by: Rosiland Oz, CRNA Pre-anesthesia Checklist: Patient identified, Emergency Drugs available, Suction available, Patient being monitored and Timeout performed Patient Re-evaluated:Patient Re-evaluated prior to induction Oxygen Delivery Method: Circle system utilized Preoxygenation: Pre-oxygenation with 100% oxygen Induction Type: IV induction Ventilation: Mask ventilation with difficulty Laryngoscope Size: Miller and 2 Grade View: Grade I Tube type: Oral Tube size: 7.5 mm Number of attempts: 1 Airway Equipment and Method: Stylet Placement Confirmation: ETT inserted through vocal cords under direct vision,  positive ETCO2 and breath sounds checked- equal and bilateral Secured at: 21 cm Tube secured with: Tape Dental Injury: Teeth and Oropharynx as per pre-operative assessment

## 2020-06-01 ENCOUNTER — Encounter (HOSPITAL_COMMUNITY): Payer: Self-pay | Admitting: Student

## 2020-08-10 ENCOUNTER — Other Ambulatory Visit: Payer: Self-pay

## 2020-08-10 ENCOUNTER — Ambulatory Visit (INDEPENDENT_AMBULATORY_CARE_PROVIDER_SITE_OTHER): Payer: Self-pay | Admitting: Internal Medicine

## 2020-08-10 ENCOUNTER — Encounter: Payer: Self-pay | Admitting: Internal Medicine

## 2020-08-10 VITALS — BP 137/90 | HR 93 | Temp 97.2°F | Resp 16 | Ht 68.5 in | Wt 110.4 lb

## 2020-08-10 DIAGNOSIS — D649 Anemia, unspecified: Secondary | ICD-10-CM

## 2020-08-10 DIAGNOSIS — R1033 Periumbilical pain: Secondary | ICD-10-CM

## 2020-08-10 DIAGNOSIS — R590 Localized enlarged lymph nodes: Secondary | ICD-10-CM

## 2020-08-10 DIAGNOSIS — T07XXXA Unspecified multiple injuries, initial encounter: Secondary | ICD-10-CM

## 2020-08-10 DIAGNOSIS — S42352D Displaced comminuted fracture of shaft of humerus, left arm, subsequent encounter for fracture with routine healing: Secondary | ICD-10-CM

## 2020-08-10 DIAGNOSIS — Z7689 Persons encountering health services in other specified circumstances: Secondary | ICD-10-CM | POA: Insufficient documentation

## 2020-08-10 NOTE — Patient Instructions (Signed)
Please do not apply isopropyl alcohol or other chemicals to the healing wounds.  Okay to take shower. Please keep the wound areas clean and dry after shower.  You are being scheduled to get Ultrasound of the axillary area for the lymph node.

## 2020-08-12 ENCOUNTER — Other Ambulatory Visit: Payer: Self-pay

## 2020-08-12 ENCOUNTER — Ambulatory Visit (INDEPENDENT_AMBULATORY_CARE_PROVIDER_SITE_OTHER): Payer: Self-pay | Admitting: Podiatry

## 2020-08-12 ENCOUNTER — Encounter: Payer: Self-pay | Admitting: Podiatry

## 2020-08-12 VITALS — Temp 98.3°F

## 2020-08-12 DIAGNOSIS — S90212A Contusion of left great toe with damage to nail, initial encounter: Secondary | ICD-10-CM

## 2020-08-12 LAB — CBC WITH DIFFERENTIAL/PLATELET
Basophils Absolute: 0 10*3/uL (ref 0.0–0.2)
Basos: 1 %
EOS (ABSOLUTE): 0 10*3/uL (ref 0.0–0.4)
Eos: 1 %
Hematocrit: 44.8 % (ref 37.5–51.0)
Hemoglobin: 13.5 g/dL (ref 13.0–17.7)
Immature Grans (Abs): 0 10*3/uL (ref 0.0–0.1)
Immature Granulocytes: 0 %
Lymphocytes Absolute: 1.9 10*3/uL (ref 0.7–3.1)
Lymphs: 32 %
MCH: 22.9 pg — ABNORMAL LOW (ref 26.6–33.0)
MCHC: 30.1 g/dL — ABNORMAL LOW (ref 31.5–35.7)
MCV: 76 fL — ABNORMAL LOW (ref 79–97)
Monocytes Absolute: 0.5 10*3/uL (ref 0.1–0.9)
Monocytes: 8 %
Neutrophils Absolute: 3.5 10*3/uL (ref 1.4–7.0)
Neutrophils: 58 %
Platelets: 259 10*3/uL (ref 150–450)
RBC: 5.89 x10E6/uL — ABNORMAL HIGH (ref 4.14–5.80)
RDW: 13.7 % (ref 11.6–15.4)
WBC: 6 10*3/uL (ref 3.4–10.8)

## 2020-08-12 LAB — LIPID PANEL
Chol/HDL Ratio: 2.5 ratio (ref 0.0–5.0)
Cholesterol, Total: 190 mg/dL (ref 100–199)
HDL: 76 mg/dL (ref >39)
LDL Chol Calc (NIH): 103 mg/dL — ABNORMAL HIGH (ref 0–99)
Triglycerides: 58 mg/dL (ref 0–149)
VLDL Cholesterol Cal: 11 mg/dL (ref 5–40)

## 2020-08-12 LAB — CMP14+EGFR
ALT: 18 IU/L (ref 0–44)
AST: 27 IU/L (ref 0–40)
Albumin/Globulin Ratio: 1.5 (ref 1.2–2.2)
Albumin: 5 g/dL (ref 4.0–5.0)
Alkaline Phosphatase: 78 IU/L (ref 44–121)
BUN/Creatinine Ratio: 8 — ABNORMAL LOW (ref 9–20)
BUN: 8 mg/dL (ref 6–20)
Bilirubin Total: 0.6 mg/dL (ref 0.0–1.2)
CO2: 23 mmol/L (ref 20–29)
Calcium: 10 mg/dL (ref 8.7–10.2)
Chloride: 100 mmol/L (ref 96–106)
Creatinine, Ser: 1.05 mg/dL (ref 0.76–1.27)
GFR calc Af Amer: 106 mL/min/{1.73_m2} (ref >59)
GFR calc non Af Amer: 92 mL/min/{1.73_m2} (ref >59)
Globulin, Total: 3.4 g/dL (ref 1.5–4.5)
Glucose: 83 mg/dL (ref 65–99)
Potassium: 4.5 mmol/L (ref 3.5–5.2)
Sodium: 140 mmol/L (ref 134–144)
Total Protein: 8.4 g/dL (ref 6.0–8.5)

## 2020-08-12 LAB — VITAMIN D 25 HYDROXY (VIT D DEFICIENCY, FRACTURES): Vit D, 25-Hydroxy: 25.8 ng/mL — ABNORMAL LOW (ref 30.0–100.0)

## 2020-08-12 LAB — TSH: TSH: 0.742 u[IU]/mL (ref 0.450–4.500)

## 2020-08-12 NOTE — Progress Notes (Signed)
  Subjective:  Patient ID: Jeremiah Munoz, male    DOB: 28-Aug-1986,  MRN: [id number]  Chief Complaint  Patient presents with  . Nail Problem    Left hallux toenail fell off after injury in Sept. now regrowing in odd manner.    34 y.o. male presents with the above complaint.  Patient presents with complaint of left hallux nail contusion after which the nail fell off and last September.  Patient just wants to get it evaluated make sure that there is nothing going on.  This is simply a nail check.  He presents with his mother today.  He states that his wound VAC in an awkward manner and wanted to know if the regrowth was normal.  He denies any other acute complaints.  He has not seen anyone else prior to seeing me for this.   Review of Systems: Negative except as noted in the HPI. Denies N/V/F/Ch.  Past Medical History:  Diagnosis Date  . Anemia    No current outpatient medications on file.  Social History   Tobacco Use  Smoking Status Never Smoker  Smokeless Tobacco Never Used    No Known Allergies Objective:   Vitals:   08/12/20 0949  Temp: 98.3 F (36.8 C)   There is no height or weight on file to calculate BMI. Constitutional Well developed. Well nourished.  Vascular Dorsalis pedis pulses palpable bilaterally. Posterior tibial pulses palpable bilaterally. Capillary refill normal to all digits.  No cyanosis or clubbing noted. Pedal hair growth normal.  Neurologic Normal speech. Oriented to person, place, and time. Epicritic sensation to light touch grossly present bilaterally.  Dermatologic  left hallux new nail growth noted.  Appears to be slightly dystrophic and discolored.  This is likely due to the trauma of having the nail removed last time.  No signs of fungus noted.  No bleeding noted.  No infection noted.  Orthopedic: Normal joint ROM without pain or crepitus bilaterally. No visible deformities. No bony tenderness.   Radiographs: None Assessment:   1.  Contusion of left great toe with damage to nail, initial encounter    Plan:  Patient was evaluated and treated and all questions answered.  Left hallux nail contusion status post self avulsion -I explained to patient the etiology of nail contusion worse treatment options were discussed.  Given that this self avulsed off at this time we do not need to do any kind of procedure.  The new nail is growing the best it could.  I discussed with the patient that unfortunately once the damage is done to the nail it may not grow back the same way.  I have educated him through them to continue monitoring the nail growth and if there is acute pain with it we can discuss having/doing toenail avulsion.  They state understanding.  They will continue applying Neosporin and a Band-Aid for next 2 weeks.  No follow-ups on file.

## 2020-08-13 DIAGNOSIS — R1033 Periumbilical pain: Secondary | ICD-10-CM | POA: Insufficient documentation
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
## 2020-08-13 NOTE — Assessment & Plan Note (Addendum)
Intermittent, resolved now No concern for acid reflux Could be related to constipation related to iron tablets Observe for now

## 2020-08-13 NOTE — Assessment & Plan Note (Signed)
Chart review suggests history of anemia No symptoms or signs of overt bleeding On iron supplementation.  Advised to continue for now. We will check CBC with differential

## 2020-08-13 NOTE — Assessment & Plan Note (Signed)
Single right axillary LAD, no signs of infection in the axilla currently Nontender, mobile lymph node Check [place] of lymph node
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
## 2020-08-13 NOTE — Assessment & Plan Note (Signed)
Care established Previous chart reviewed History and medications reviewed with the patient 

## 2020-08-13 NOTE — Assessment & Plan Note (Signed)
Well-healed, follows up with orthopedic surgeon Advised to avoid putting any chemicals on the site

## 2020-08-20 ENCOUNTER — Ambulatory Visit (HOSPITAL_COMMUNITY): Payer: Self-pay

## 2020-09-09 ENCOUNTER — Other Ambulatory Visit: Payer: Self-pay

## 2020-09-09 ENCOUNTER — Ambulatory Visit (HOSPITAL_COMMUNITY)
Admission: RE | Admit: 2020-09-09 | Discharge: 2020-09-09 | Disposition: A | Discharge status: Home / Self Care | Payer: Self-pay | Source: Ambulatory Visit | Attending: Internal Medicine | Admitting: Internal Medicine

## 2020-09-09 DIAGNOSIS — R590 Localized enlarged lymph nodes: Secondary | ICD-10-CM | POA: Insufficient documentation

## 2020-09-21 ENCOUNTER — Ambulatory Visit: Payer: Self-pay | Admitting: Internal Medicine

## 2020-09-21 ENCOUNTER — Other Ambulatory Visit: Payer: Self-pay

## 2020-09-21 ENCOUNTER — Ambulatory Visit (INDEPENDENT_AMBULATORY_CARE_PROVIDER_SITE_OTHER): Payer: Self-pay | Admitting: Internal Medicine

## 2020-09-21 ENCOUNTER — Encounter: Payer: Self-pay | Admitting: Internal Medicine

## 2020-09-21 VITALS — BP 147/100 | HR 98 | Temp 98.2°F | Resp 18 | Ht 68.0 in | Wt 114.4 lb

## 2020-09-21 DIAGNOSIS — F419 Anxiety disorder, unspecified: Secondary | ICD-10-CM | POA: Insufficient documentation

## 2020-09-21 DIAGNOSIS — G43809 Other migraine, not intractable, without status migrainosus: Secondary | ICD-10-CM

## 2020-09-21 DIAGNOSIS — R718 Other abnormality of red blood cells: Secondary | ICD-10-CM

## 2020-09-21 DIAGNOSIS — R590 Localized enlarged lymph nodes: Secondary | ICD-10-CM

## 2020-09-21 DIAGNOSIS — E559 Vitamin D deficiency, unspecified: Secondary | ICD-10-CM

## 2020-09-21 DIAGNOSIS — F411 Generalized anxiety disorder: Secondary | ICD-10-CM

## 2020-09-21 DIAGNOSIS — G43909 Migraine, unspecified, not intractable, without status migrainosus: Secondary | ICD-10-CM | POA: Insufficient documentation

## 2020-09-21 MED ORDER — ESCITALOPRAM OXALATE 10 MG PO TABS: 10.0000 mg | ORAL_TABLET | Freq: Every day | ORAL | 3 refills | Status: DC

## 2020-09-21 MED ORDER — SUMATRIPTAN SUCCINATE 50 MG PO TABS: 50.0000 mg | ORAL_TABLET | ORAL | 2 refills | Status: DC | PRN

## 2020-09-21 NOTE — Patient Instructions (Addendum)
Please continue to take Iron supplement about 3 day/week. Please start taking Vitamin D 1000 IU once daily.  Please start taking Lexapro as prescribed.  Please take Sumatriptan for migraine. Maximum 2 tablets in a day.

## 2020-09-21 NOTE — Assessment & Plan Note (Signed)
No appreciable axillary LAD in this visit, could be due to local infection versus local healing process. Will wait for the [place] results.

## 2020-09-21 NOTE — Assessment & Plan Note (Signed)
Reports history of headaches associated with nausea, photophobia and phonophobia Lasting for few hours to days Tylenol as needed Trial of sumatriptan as needed If patient has recurrent migraine episodes, can give a trial of Topamax or beta-blocker, which can also address anxiety.

## 2020-09-21 NOTE — Assessment & Plan Note (Addendum)
History of anxiety in different settings, including at home, at work and with his health Patient is preoccupied with his health concerns. Patient is always accompanied by his mother during the office visits, and his mother appears to be taking control of his daily living activities.  Unclear if patient's behavior is because of his mother's excessive concerns or vice versa Started Escitalopram Likely reason for high BP, would treat anxiety first and check BP in the next visit.
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
## 2020-09-21 NOTE — Assessment & Plan Note (Signed)
History of microcytic anemia likely due to iron deficiency in the setting of blood loss Hb wnl, MCV low On iron supplements No signs of active bleeding currently.  Patient and his mother are constantly worried about the possibility of Leukemia. WBC wnl with unremarkable differentials. I do not see any reason for extensive workup for Leukemia at this point. Discussed with the patient at length.
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
## 2020-09-21 NOTE — Assessment & Plan Note (Deleted)
History of anxiety in different settings, including at home, at work and with his health Patient is preoccupied with his health concerns. Patient is always accompanied by his mother during the office visits, and his mother appears to be taking control of his daily living activities.  Unclear if patient's behavior is because of his mother's excessive concerns or vice versa

## 2021-01-18 ENCOUNTER — Telehealth: Payer: Self-pay

## 2021-01-18 ENCOUNTER — Other Ambulatory Visit: Payer: Self-pay

## 2021-01-18 DIAGNOSIS — G43809 Other migraine, not intractable, without status migrainosus: Secondary | ICD-10-CM

## 2021-01-18 DIAGNOSIS — F411 Generalized anxiety disorder: Secondary | ICD-10-CM

## 2021-01-18 MED ORDER — SUMATRIPTAN SUCCINATE 50 MG PO TABS: 50.0000 mg | ORAL_TABLET | ORAL | 2 refills | Status: AC | PRN

## 2021-01-18 MED ORDER — ESCITALOPRAM OXALATE 10 MG PO TABS: 10.0000 mg | ORAL_TABLET | Freq: Every day | ORAL | 3 refills | Status: DC

## 2021-01-18 NOTE — Telephone Encounter (Signed)
Patient called and need med refills  escitalopram (LEXAPRO) 10 MG tablet   SUMAtriptan (IMITREX) 50 MG tablet   Pharmacy: [facility]

## 2021-01-18 NOTE — Telephone Encounter (Signed)
Rx refill sent.

## 2021-03-23 ENCOUNTER — Ambulatory Visit: Payer: Self-pay | Admitting: Internal Medicine

## 2021-06-11 ENCOUNTER — Telehealth: Payer: Self-pay

## 2021-06-11 ENCOUNTER — Other Ambulatory Visit: Payer: Self-pay | Admitting: *Deleted

## 2021-06-11 ENCOUNTER — Other Ambulatory Visit: Payer: Self-pay | Admitting: Internal Medicine

## 2021-06-11 DIAGNOSIS — F411 Generalized anxiety disorder: Secondary | ICD-10-CM

## 2021-06-11 MED ORDER — ESCITALOPRAM OXALATE 10 MG PO TABS: 10.0000 mg | ORAL_TABLET | Freq: Every day | ORAL | 3 refills | Status: DC

## 2021-06-11 NOTE — Telephone Encounter (Signed)
Pt medication sent to pharmacy  

## 2021-06-11 NOTE — Telephone Encounter (Signed)
Patient called need med refill, I tried to get him to reschedule appointment, he said too much going on right now.   escitalopram (LEXAPRO) 10 MG tablet   Pharmacy: [facility]
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
## 2021-09-08 ENCOUNTER — Ambulatory Visit: Payer: Self-pay
